# Patient Record
Sex: Female | Born: 2000 | Race: White | Hispanic: No | Marital: Single | Smoking: Never smoker
Health system: Southern US, Community
[De-identification: ages and names within clinical notes are randomized; demographics above are authoritative.]

## PROBLEM LIST (undated history)

## (undated) DIAGNOSIS — F329 Major depressive disorder, single episode, unspecified: Secondary | ICD-10-CM

## (undated) DIAGNOSIS — F32A Depression, unspecified: Secondary | ICD-10-CM

## (undated) DIAGNOSIS — J45909 Unspecified asthma, uncomplicated: Secondary | ICD-10-CM

## (undated) DIAGNOSIS — F419 Anxiety disorder, unspecified: Secondary | ICD-10-CM

## (undated) DIAGNOSIS — Z91010 Allergy to peanuts: Secondary | ICD-10-CM

## (undated) HISTORY — DX: Major depressive disorder, single episode, unspecified: F32.9

## (undated) HISTORY — PX: TONSILLECTOMY: SUR1361

## (undated) HISTORY — DX: Allergy to peanuts: Z91.010

## (undated) HISTORY — DX: Depression, unspecified: F32.A

## (undated) HISTORY — DX: Anxiety disorder, unspecified: F41.9

## (undated) HISTORY — DX: Unspecified asthma, uncomplicated: J45.909

---
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
## 2020-01-12 NOTE — Progress Notes (Deleted)
  Subjective:    CC: mood follow up  HPI: Started Prozac and hydroxyzine for GAD around 4 weeks ago  I reviewed the past medical history, family history, social history, surgical history, and allergies today and no changes were needed.  Please see the problem list section below in epic for further details.  Past Medical History: Past Medical History:  Diagnosis Date  . Anxiety   . Asthma   . Depression   . Peanut allergy    Past Surgical History: Past Surgical History:  Procedure Laterality Date  . TONSILLECTOMY     Social History: Social History   Socioeconomic History  . Marital status: Single    Spouse name: Not on file  . Number of children: Not on file  . Years of education: Not on file  . Highest education level: Not on file  Occupational History  . Not on file  Tobacco Use  . Smoking status: Never Smoker  . Smokeless tobacco: Never Used  Substance and Sexual Activity  . Alcohol use: Yes    Alcohol/week: 4.0 standard drinks    Types: 4 Standard drinks or equivalent per week  . Drug use: Not Currently    Types: Marijuana  . Sexual activity: Yes    Birth control/protection: Injection  Other Topics Concern  . Not on file  Social History Narrative  . Not on file   Social Determinants of Health   Financial Resource Strain:   . Difficulty of Paying Living Expenses:   Food Insecurity:   . Worried About Programme researcher, broadcasting/film/video in the Last Year:   . Barista in the Last Year:   Transportation Needs:   . Freight forwarder (Medical):   Marland Kitchen Lack of Transportation (Non-Medical):   Physical Activity:   . Days of Exercise per Week:   . Minutes of Exercise per Session:   Stress:   . Feeling of Stress :   Social Connections:   . Frequency of Communication with Friends and Family:   . Frequency of Social Gatherings with Friends and Family:   . Attends Religious Services:   . Active Member of Clubs or Organizations:   . Attends Banker  Meetings:   Marland Kitchen Marital Status:    Family History: Family History  Problem Relation Age of Onset  . Alcohol abuse Father    Allergies: Allergies  Allergen Reactions  . Peanut Oil Anaphylaxis and Hives  . Sulfamethoxazole-Trimethoprim Hives and Rash  . Z-Pak [Azithromycin] Hives  . Amoxicillin Rash   Medications: See med rec.  Review of Systems: No fevers, chills, night sweats, weight loss, chest pain, or shortness of breath.   Objective:    General: Well Developed, well nourished, and in no acute distress.  Neuro: Alert and oriented x3.  HEENT: Normocephalic, atraumatic.  Skin: Warm and dry. Cardiac: Regular rate and rhythm, no murmurs rubs or gallops, no lower extremity edema.  Respiratory: Clear to auscultation bilaterally. Not using accessory muscles, speaking in full sentences.   Impression and Recommendations:    No problem-specific Assessment & Plan notes found for this encounter.  No follow-ups on file. ___________________________________________ Thayer Ohm, DNP, APRN, FNP-BC Primary Care and Sports Medicine [facility] [place]

## 2020-01-13 ENCOUNTER — Ambulatory Visit (INDEPENDENT_AMBULATORY_CARE_PROVIDER_SITE_OTHER): Payer: No Typology Code available for payment source | Admitting: Psychology

## 2020-01-13 ENCOUNTER — Ambulatory Visit (INDEPENDENT_AMBULATORY_CARE_PROVIDER_SITE_OTHER): Payer: No Typology Code available for payment source | Admitting: Medical-Surgical

## 2020-01-13 DIAGNOSIS — F331 Major depressive disorder, recurrent, moderate: Secondary | ICD-10-CM

## 2020-01-13 DIAGNOSIS — F411 Generalized anxiety disorder: Secondary | ICD-10-CM

## 2020-01-13 DIAGNOSIS — Z5329 Procedure and treatment not carried out because of patient's decision for other reasons: Secondary | ICD-10-CM

## 2020-01-14 ENCOUNTER — Telehealth (HOSPITAL_COMMUNITY): Payer: No Typology Code available for payment source | Admitting: Psychiatry

## 2020-01-28 ENCOUNTER — Ambulatory Visit (INDEPENDENT_AMBULATORY_CARE_PROVIDER_SITE_OTHER): Payer: No Typology Code available for payment source | Admitting: Psychology

## 2020-01-28 DIAGNOSIS — F331 Major depressive disorder, recurrent, moderate: Secondary | ICD-10-CM

## 2020-01-28 DIAGNOSIS — F411 Generalized anxiety disorder: Secondary | ICD-10-CM

## 2020-02-10 ENCOUNTER — Telehealth: Payer: Self-pay | Admitting: Medical-Surgical

## 2020-02-10 NOTE — Telephone Encounter (Signed)
Please schedule with PCP for the initial discussion of taking over the birth control. Also due for follow up on anxiety. Can do in the same appointment.

## 2020-02-10 NOTE — Telephone Encounter (Signed)
Please schedule the patient an appointment with the provider since she is transferring care/rx. Thanks.

## 2020-02-10 NOTE — Telephone Encounter (Signed)
Left voicemail for patient to call us back to make an appointment.  

## 2020-02-10 NOTE — Telephone Encounter (Signed)
Patient had her last depo shot may 3rd and her time is coming up for next appointment. Would like to start it here with [name] and records can be transferred so we can see that she has been doing it for a while. Please advise if she can see a nurse or if it has to be with PCP.

## 2020-02-11 ENCOUNTER — Ambulatory Visit (INDEPENDENT_AMBULATORY_CARE_PROVIDER_SITE_OTHER): Payer: No Typology Code available for payment source | Admitting: Psychology

## 2020-02-11 DIAGNOSIS — F331 Major depressive disorder, recurrent, moderate: Secondary | ICD-10-CM

## 2020-02-11 DIAGNOSIS — F411 Generalized anxiety disorder: Secondary | ICD-10-CM

## 2020-02-25 ENCOUNTER — Ambulatory Visit (INDEPENDENT_AMBULATORY_CARE_PROVIDER_SITE_OTHER): Payer: Self-pay | Admitting: Physician Assistant

## 2020-02-25 ENCOUNTER — Other Ambulatory Visit: Payer: Self-pay

## 2020-02-25 ENCOUNTER — Encounter: Payer: Self-pay | Admitting: Physician Assistant

## 2020-02-25 VITALS — BP 124/74 | HR 90 | Temp 98.6°F | Ht 61.0 in | Wt 165.0 lb

## 2020-02-25 DIAGNOSIS — N3001 Acute cystitis with hematuria: Secondary | ICD-10-CM

## 2020-02-25 DIAGNOSIS — N898 Other specified noninflammatory disorders of vagina: Secondary | ICD-10-CM

## 2020-02-25 DIAGNOSIS — R300 Dysuria: Secondary | ICD-10-CM

## 2020-02-25 LAB — POCT URINALYSIS DIP (CLINITEK)
Bilirubin, UA: NEGATIVE
Glucose, UA: NEGATIVE mg/dL
Ketones, POC UA: NEGATIVE mg/dL
Nitrite, UA: NEGATIVE
POC PROTEIN,UA: NEGATIVE
Spec Grav, UA: >=1.03 — AB (ref 1.010–1.025)
Urobilinogen, UA: 0.2 E.U./dL
pH, UA: 5.5 (ref 5.0–8.0)

## 2020-02-25 LAB — WET PREP FOR TRICH, YEAST, CLUE
MICRO NUMBER:: 10704062
Specimen Quality: ADEQUATE

## 2020-02-25 MED ORDER — FLUCONAZOLE 150 MG PO TABS: 150.0000 mg | ORAL_TABLET | Freq: Once | ORAL | 0 refills | Status: AC

## 2020-02-25 MED ORDER — NITROFURANTOIN MONOHYD MACRO 100 MG PO CAPS: 100.0000 mg | ORAL_CAPSULE | Freq: Two times a day (BID) | ORAL | 0 refills | Status: DC

## 2020-02-25 NOTE — Progress Notes (Signed)
No trich/yeast/clue cells seen.

## 2020-02-25 NOTE — Progress Notes (Signed)
Subjective:    Patient ID: Kathy Cummings, female    DOB: Apr 10, 2001, 19 y.o.   MRN: [id number]  HPI Pt is a 19 yo female with hx of UTI who presents to the clinic with dysuria and urinary frequency for 2 days. She also has some external itching. No fever, chills, nausea, vomiting, body aches, abdominal or flank pain. Not tried anything to make better. Has had pyelonephritis in the past.   On depo UTD.   .. Active Ambulatory Problems    Diagnosis Date Noted  . Peanut allergy   . Depression in pediatric patient 09/27/2018  . GAD (generalized anxiety disorder) 09/27/2018  . Suicidal thoughts 09/27/2018  . Allergic rhinitis due to pollen 05/01/2008  . Asthma 10/12/2010  . Atopic dermatitis 08/05/2012  . BMI (body mass index), pediatric, 95-99% for age 45/15/2016  . Snoring 03/31/2016  . Allergic to nuts (other than peanuts) 11/24/2011  . Low serum thyroid stimulating hormone (TSH) 04/29/2019   Resolved Ambulatory Problems    Diagnosis Date Noted  . No Resolved Ambulatory Problems   Past Medical History:  Diagnosis Date  . Anxiety   . Depression       Review of Systems See HPI    Objective:   Physical Exam Vitals reviewed.  Constitutional:      Appearance: Normal appearance.  Cardiovascular:     Rate and Rhythm: Normal rate and regular rhythm.     Pulses: Normal pulses.  Pulmonary:     Effort: Pulmonary effort is normal.     Breath sounds: Normal breath sounds.  Abdominal:     General: There is no distension.     Tenderness: There is no abdominal tenderness. There is no right CVA tenderness, left CVA tenderness, guarding or rebound.  Neurological:     General: No focal deficit present.     Mental Status: She is alert.  Psychiatric:        Mood and Affect: Mood normal.           Assessment & Plan:    .Marland KitchenMarland KitchenAlexa was seen today for vaginal itching and dysuria.  Diagnoses and all orders for this visit:  Acute cystitis with hematuria -      nitrofurantoin, macrocrystal-monohydrate, (MACROBID) 100 MG capsule; Take 1 capsule (100 mg total) by mouth 2 (two) times daily.  Vagina itching -     POCT URINALYSIS DIP (CLINITEK) -     WET PREP FOR TRICH, YEAST, CLUE -     Urine Culture -     fluconazole (DIFLUCAN) 150 MG tablet; Take 1 tablet (150 mg total) by mouth once for 1 dose.  Dysuria -     POCT URINALYSIS DIP (CLINITEK) -     WET PREP FOR TRICH, YEAST, CLUE -     Urine Culture    Results for orders placed or performed in visit on 02/25/20  POCT URINALYSIS DIP (CLINITEK)  Result Value Ref Range   Color, UA yellow yellow   Clarity, UA clear clear   Glucose, UA negative negative mg/dL   Bilirubin, UA negative negative   Ketones, POC UA negative negative mg/dL   Spec Grav, UA >=2.423 (A) 1.010 - 1.025   Blood, UA large (A) negative   pH, UA 5.5 5.0 - 8.0   POC PROTEIN,UA negative negative, trace   Urobilinogen, UA 0.2 0.2 or 1.0 E.U./dL   Nitrite, UA Negative Negative   Leukocytes, UA Trace (A) Negative   UA positive for blood and leukocytes.  Will culture.  Wet prep ordered.  Will treat empirically due to symptoms and history with macrobid.  HO given. Follow up as needed or with any changing or worsening symptoms.

## 2020-02-25 NOTE — Patient Instructions (Signed)
Urinary Tract Infection, Adult A urinary tract infection (UTI) is an infection of any part of the urinary tract. The urinary tract includes:  The kidneys.  The ureters.  The bladder.  The urethra. These organs make, store, and get rid of pee (urine) in the body. What are the causes? This is caused by germs (bacteria) in your genital area. These germs grow and cause swelling (inflammation) of your urinary tract. What increases the risk? You are more likely to develop this condition if:  You have a small, thin tube (catheter) to drain pee.  You cannot control when you pee or poop (incontinence).  You are female, and: ? You use these methods to prevent pregnancy:  A medicine that kills sperm (spermicide).  A device that blocks sperm (diaphragm). ? You have low levels of a female hormone (estrogen). ? You are pregnant.  You have genes that add to your risk.  You are sexually active.  You take antibiotic medicines.  You have trouble peeing because of: ? A prostate that is bigger than normal, if you are female. ? A blockage in the part of your body that drains pee from the bladder (urethra). ? A kidney stone. ? A nerve condition that affects your bladder (neurogenic bladder). ? Not getting enough to drink. ? Not peeing often enough.  You have other conditions, such as: ? Diabetes. ? A weak disease-fighting system (immune system). ? Sickle cell disease. ? Gout. ? Injury of the spine. What are the signs or symptoms? Symptoms of this condition include:  Needing to pee right away (urgently).  Peeing often.  Peeing small amounts often.  Pain or burning when peeing.  Blood in the pee.  Pee that smells bad or not like normal.  Trouble peeing.  Pee that is cloudy.  Fluid coming from the vagina, if you are female.  Pain in the belly or lower back. Other symptoms include:  Throwing up (vomiting).  No urge to eat.  Feeling mixed up (confused).  Being tired  and grouchy (irritable).  A fever.  Watery poop (diarrhea). How is this treated? This condition may be treated with:  Antibiotic medicine.  Other medicines.  Drinking enough water. Follow these instructions at home:  Medicines  Take over-the-counter and prescription medicines only as told by your doctor.  If you were prescribed an antibiotic medicine, take it as told by your doctor. Do not stop taking it even if you start to feel better. General instructions  Make sure you: ? Pee until your bladder is empty. ? Do not hold pee for a long time. ? Empty your bladder after sex. ? Wipe from front to back after pooping if you are a female. Use each tissue one time when you wipe.  Drink enough fluid to keep your pee pale yellow.  Keep all follow-up visits as told by your doctor. This is important. Contact a doctor if:  You do not get better after 1-2 days.  Your symptoms go away and then come back. Get help right away if:  You have very bad back pain.  You have very bad pain in your lower belly.  You have a fever.  You are sick to your stomach (nauseous).  You are throwing up. Summary  A urinary tract infection (UTI) is an infection of any part of the urinary tract.  This condition is caused by germs in your genital area.  There are many risk factors for a UTI. These include having a small, thin   tube to drain pee and not being able to control when you pee or poop.  Treatment includes antibiotic medicines for germs.  Drink enough fluid to keep your pee pale yellow. This information is not intended to replace advice given to you by your health care provider. Make sure you discuss any questions you have with your health care provider. Document Revised: 07/18/2018 Document Reviewed: 02/07/2018 Elsevier Patient Education  2020 [employer] Inc[employer]  

## 2020-02-26 LAB — URINE CULTURE
MICRO NUMBER:: 10704813
SPECIMEN QUALITY:: ADEQUATE

## 2020-02-27 NOTE — Progress Notes (Signed)
No significant bacteria growth. Likely sample was contaminated. Ok to finish abx. Have symptoms resolved?

## 2020-03-01 ENCOUNTER — Other Ambulatory Visit: Payer: Self-pay | Admitting: Physician Assistant

## 2020-03-01 DIAGNOSIS — J4520 Mild intermittent asthma, uncomplicated: Secondary | ICD-10-CM

## 2020-03-08 ENCOUNTER — Ambulatory Visit: Payer: No Typology Code available for payment source | Admitting: Psychology

## 2020-03-15 ENCOUNTER — Other Ambulatory Visit: Payer: Self-pay

## 2020-03-15 ENCOUNTER — Encounter: Payer: Self-pay | Admitting: Medical-Surgical

## 2020-03-15 ENCOUNTER — Ambulatory Visit (INDEPENDENT_AMBULATORY_CARE_PROVIDER_SITE_OTHER): Payer: No Typology Code available for payment source | Admitting: Medical-Surgical

## 2020-03-15 VITALS — BP 109/75 | HR 85 | Temp 98.1°F | Ht 61.0 in | Wt 163.7 lb

## 2020-03-15 DIAGNOSIS — F411 Generalized anxiety disorder: Secondary | ICD-10-CM | POA: Diagnosis not present

## 2020-03-15 DIAGNOSIS — Z3042 Encounter for surveillance of injectable contraceptive: Secondary | ICD-10-CM | POA: Diagnosis not present

## 2020-03-15 DIAGNOSIS — H02823 Cysts of right eye, unspecified eyelid: Secondary | ICD-10-CM

## 2020-03-15 LAB — POCT URINE PREGNANCY: Preg Test, Ur: NEGATIVE

## 2020-03-15 MED ORDER — FLUOXETINE HCL 10 MG PO CAPS: 10.0000 mg | ORAL_CAPSULE | Freq: Every day | ORAL | 1 refills | Status: DC

## 2020-03-15 MED ORDER — MEDROXYPROGESTERONE ACETATE 150 MG/ML IM SUSP
150.0000 mg | Freq: Once | INTRAMUSCULAR | Status: AC
  Administered 2020-03-15: 150 mg via INTRAMUSCULAR

## 2020-03-15 MED ORDER — HYDROXYZINE HCL 25 MG PO TABS: 25.0000 mg | ORAL_TABLET | Freq: Three times a day (TID) | ORAL | 2 refills | Status: AC | PRN

## 2020-03-15 NOTE — Progress Notes (Signed)
Subjective:    CC: anxiety follow up, birth control, spot on eye  HPI: Pleasant 19 year old female presenting for the following:  Anxiety/Depression: taking fluoxetine 10mg  daily, tolerating well without side effects. Ran out of her medication about 2-3 weeks ago. Feels that the medicine helps with her symptoms well and would like to continue at the 10mg  dose. Using hydroxyzine 2-3 times per week with good control of anxiety. Doing counseling twice monthly (every 2 weeks) and reports she is doing much better. Denies SI/HI.  Birth control: Has been doing Depo-Provera shots for a couple of years. Last injection on 12/15/2019 at her GYN office. Would like to switch her injections to our office. Currently sexually active with one female partner, using condoms with most encounters but not all. Had some vaginal mucus a couple of weeks ago that was similar to ovulation. Having very short irregular bleeding for approximately 24 hours then stops.   Spot on eye: has had a spot on the outer right corner of her eye that is white for almost a month. It doesn't hurt or itch. She has tried popping it and had no success. It doesn't bother her, it's more a cosmetic concern.  I reviewed the past medical history, family history, social history, surgical history, and allergies today and no changes were needed.  Please see the problem list section below in epic for further details.  Past Medical History: Past Medical History:  Diagnosis Date  . Anxiety   . Asthma   . Depression   . Peanut allergy    Past Surgical History: Past Surgical History:  Procedure Laterality Date  . TONSILLECTOMY     Social History: Social History   Socioeconomic History  . Marital status: Single    Spouse name: Not on file  . Number of children: Not on file  . Years of education: Not on file  . Highest education level: Not on file  Occupational History  . Not on file  Tobacco Use  . Smoking status: Never Smoker  .  Smokeless tobacco: Never Used  Vaping Use  . Vaping Use: Never used  Substance and Sexual Activity  . Alcohol use: Yes    Alcohol/week: 4.0 standard drinks    Types: 4 Standard drinks or equivalent per week  . Drug use: Not Currently    Types: Marijuana  . Sexual activity: Yes    Birth control/protection: Injection  Other Topics Concern  . Not on file  Social History Narrative  . Not on file   Social Determinants of Health   Financial Resource Strain:   . Difficulty of Paying Living Expenses:   Food Insecurity:   . Worried About in the Last Year:   . 02/14/2020 in the Last Year:   Transportation Needs:   . Programme researcher, broadcasting/film/video (Medical):   Barista Lack of Transportation (Non-Medical):   Physical Activity:   . Days of Exercise per Week:   . Minutes of Exercise per Session:   Stress:   . Feeling of Stress :   Social Connections:   . Frequency of Communication with Friends and Family:   . Frequency of Social Gatherings with Friends and Family:   . Attends Religious Services:   . Active Member of Clubs or Organizations:   . Attends Freight forwarder Meetings:   Marland Kitchen Marital Status:    Family History: Family History  Problem Relation Age of Onset  . Alcohol abuse Father    Allergies:  Allergies  Allergen Reactions  . Peanut Oil Anaphylaxis and Hives  . Sulfamethoxazole-Trimethoprim Hives and Rash  . Z-Pak [Azithromycin] Hives  . Amoxicillin Rash   Medications: See med rec.  Review of Systems: See HPI for pertinent positives and negatives.   Depression screen [facility] 2/9 03/15/2020 12/16/2019 04/28/2019 10/15/2018 09/27/2018  Decreased Interest 2 2 0 1 2  Down, Depressed, Hopeless 1 1 0 2 2  PHQ - 2 Score 3 3 0 3 4  Altered sleeping 2 2 2 2 3   Tired, decreased energy 2 2 2 1 2   Change in appetite 1 2 2 2 1   Feeling bad or failure about yourself  1 1 0 2 2  Trouble concentrating 2 2 2 3 2   Moving slowly or fidgety/restless 0 0 0 0 0  Suicidal  thoughts 0 0 0 2 1  PHQ-9 Score 11 12 8 15 15   Difficult doing work/chores Somewhat difficult Somewhat difficult - - -   GAD 7 : Generalized Anxiety Score 03/15/2020 12/16/2019 04/28/2019 10/15/2018  Nervous, Anxious, on Edge 2 2 1 3   Control/stop worrying 2 2 1 2   Worry too much - different things 2 2 1 2   Trouble relaxing 1 1 0 2  Restless 1 0 0 3  Easily annoyed or irritable 3 3 3 3   Afraid - awful might happen 0 1 0 2  Total GAD 7 Score 11 11 6 17   Anxiety Difficulty Very difficult Very difficult - -    Objective:    General: Well Developed, well nourished, and in no acute distress.  Neuro: Alert and oriented x3.  HEENT: Normocephalic, atraumatic.  Skin: Warm and dry. Small pinpoint white lesion without erythema, edema, or drainage. Cardiac: Regular rate and rhythm, no murmurs rubs or gallops, no lower extremity edema.  Respiratory: Clear to auscultation bilaterally. Not using accessory muscles, speaking in full sentences.  Impression and Recommendations:    1. Encounter for surveillance of injectable contraceptive POCT UPT due to recent mucoid changes similar to ovulation- negative. Depo-Provera injection given by MA. Patient instructed to return in 12-14 weeks (06/07/2020-06/21/2020) for next injection. Continue condom use with EVERY encounter to protect from STIs.  2. GAD (generalized anxiety disorder) Continue fluoxetine 10mg  daily and hydroxyzine 25mg  TID prn anxiety. Continue counseling as instructed. If symptoms worsen or medication doesn't manage as well, advised to return for discussion of increasing the fluoxetine dose. Refills provided.  3. Milia of right eye Discussed benign nature of milia. May consider using warm compresses several times daily to help resolve. Avoid squeezing or manipulating the site as this may predispose to infection.   Return in about 13 weeks (around 06/14/2020) for next depo-provera injection; 6 months for mood follow  up. ___________________________________________ 12/15/2018, DNP, APRN, FNP-BC Primary Care and Sports Medicine [facility] [place]

## 2020-06-14 ENCOUNTER — Other Ambulatory Visit: Payer: Self-pay

## 2020-06-14 ENCOUNTER — Ambulatory Visit (INDEPENDENT_AMBULATORY_CARE_PROVIDER_SITE_OTHER): Payer: No Typology Code available for payment source | Admitting: Medical-Surgical

## 2020-06-14 DIAGNOSIS — Z3042 Encounter for surveillance of injectable contraceptive: Secondary | ICD-10-CM

## 2020-06-14 LAB — POCT URINE PREGNANCY: Preg Test, Ur: NEGATIVE

## 2020-06-14 MED ORDER — MEDROXYPROGESTERONE ACETATE 150 MG/ML IM SUSP
150.0000 mg | Freq: Once | INTRAMUSCULAR | Status: AC
  Administered 2020-06-14: 150 mg via INTRAMUSCULAR

## 2020-06-14 NOTE — Progress Notes (Signed)
Established Patient Office Visit  Subjective:  Patient ID: Kathy Cummings, female    DOB: Oct 26, 2000  Age: 19 y.o. MRN: [id number]  CC:  Chief Complaint  Patient presents with  . Contraception    HPI Kathy Cummings is here for a Depo Provera injection. Denies chest pain, shortness of breath, headaches, mood changes or problems with medication. It has been < 14 weeks since her last Depo Provera injection.   Past Medical History:  Diagnosis Date  . Anxiety   . Asthma   . Depression   . Peanut allergy     Past Surgical History:  Procedure Laterality Date  . TONSILLECTOMY      Family History  Problem Relation Age of Onset  . Alcohol abuse Father     Social History   Socioeconomic History  . Marital status: Single    Spouse name: Not on file  . Number of children: Not on file  . Years of education: Not on file  . Highest education level: Not on file  Occupational History  . Not on file  Tobacco Use  . Smoking status: Never Smoker  . Smokeless tobacco: Never Used  Vaping Use  . Vaping Use: Never used  Substance and Sexual Activity  . Alcohol use: Yes    Alcohol/week: 4.0 standard drinks    Types: 4 Standard drinks or equivalent per week  . Drug use: Not Currently    Types: Marijuana  . Sexual activity: Yes    Birth control/protection: Injection  Other Topics Concern  . Not on file  Social History Narrative  . Not on file   Social Determinants of Health   Financial Resource Strain:   . Difficulty of Paying Living Expenses: Not on file  Food Insecurity:   . Worried About Programme researcher, broadcasting/film/video in the Last Year: Not on file  . Ran Out of Food in the Last Year: Not on file  Transportation Needs:   . Lack of Transportation (Medical): Not on file  . Lack of Transportation (Non-Medical): Not on file  Physical Activity:   . Days of Exercise per Week: Not on file  . Minutes of Exercise per Session: Not on file  Stress:   . Feeling of Stress : Not  on file  Social Connections:   . Frequency of Communication with Friends and Family: Not on file  . Frequency of Social Gatherings with Friends and Family: Not on file  . Attends Religious Services: Not on file  . Active Member of Clubs or Organizations: Not on file  . Attends Banker Meetings: Not on file  . Marital Status: Not on file  Intimate Partner Violence:   . Fear of Current or Ex-Partner: Not on file  . Emotionally Abused: Not on file  . Physically Abused: Not on file  . Sexually Abused: Not on file    Outpatient Medications Prior to Visit  Medication Sig Dispense Refill  . albuterol (VENTOLIN HFA) 108 (90 Base) MCG/ACT inhaler INHALE 1 TO 2 PUFFS INTO THE LUNGS EVERY 4 HOURS AS NEEDED FOR WHEEZING OR SHORTNESS OF BREATH. 36 g 1  . EPINEPHrine 0.3 mg/0.3 mL IJ SOAJ injection Inject 0.3 mLs (0.3 mg total) into the muscle once as needed (anaphylaxis/allergic reaction). 2 each 3  . FLUoxetine (PROZAC) 10 MG capsule Take 1 capsule (10 mg total) by mouth daily. 90 capsule 1  . hydrocortisone 2.5 % ointment Apply to affected area BID. 30 g 3  . hydrOXYzine (  ATARAX/VISTARIL) 25 MG tablet Take 1 tablet (25 mg total) by mouth 3 (three) times daily as needed. 90 tablet 2  . medroxyPROGESTERone (DEPO-PROVERA) 150 MG/ML injection Inject 150 mg into the muscle every 3 (three) months.     No facility-administered medications prior to visit.    Allergies  Allergen Reactions  . Peanut Oil Anaphylaxis and Hives  . Sulfamethoxazole-Trimethoprim Hives and Rash  . Z-Pak [Azithromycin] Hives  . Amoxicillin Rash    ROS Review of Systems    Objective:    Physical Exam  There were no vitals taken for this visit. Wt Readings from Last 3 Encounters:  03/15/20 163 lb 11.2 oz (74.3 kg) (90 %, Z= 1.30)*  02/25/20 165 lb (74.8 kg) (91 %, Z= 1.34)*  12/16/19 171 lb 6.4 oz (77.7 kg) (93 %, Z= 1.49)*   * Growth percentiles are based on CDC (Girls, 2-20 Years) data.      Health Maintenance Due  Topic Date Due  . INFLUENZA VACCINE  03/14/2020  . CHLAMYDIA SCREENING  04/27/2020    There are no preventive care reminders to display for this patient.  No results found for: TSH Lab Results  Component Value Date   WBC 5.8 04/28/2019   HGB 13.3 04/28/2019   HCT 38.1 04/28/2019   MCV 91.1 04/28/2019   PLT 302 04/28/2019   Lab Results  Component Value Date   NA 141 04/28/2019   K 4.1 04/28/2019   CO2 25 04/28/2019   GLUCOSE 92 04/28/2019   BUN 9 04/28/2019   CREATININE 0.73 04/28/2019   BILITOT 1.0 04/28/2019   AST 12 04/28/2019   ALT 8 04/28/2019   PROT 7.2 04/28/2019   CALCIUM 9.9 04/28/2019   ANIONGAP 11 01/15/2019   Lab Results  Component Value Date   CHOL 190 (H) 09/27/2018   Lab Results  Component Value Date   HDL 52 09/27/2018   Lab Results  Component Value Date   LDLCALC 121 (H) 09/27/2018   Lab Results  Component Value Date   TRIG 78 09/27/2018   Lab Results  Component Value Date   CHOLHDL 3.7 09/27/2018   Lab Results  Component Value Date   HGBA1C 5.0 09/27/2018      Assessment & Plan:  Contraception - Patient tolerated injection well without complications. Patient advised to schedule next injection between 08/31/2019 - 09/14/2019  Problem List Items Addressed This Visit    None    Visit Diagnoses    Encounter for surveillance of injectable contraceptive    -  Primary   Relevant Medications   medroxyPROGESTERone (DEPO-PROVERA) injection 150 mg (Completed) (Start on 06/14/2020  4:00 PM)   Other Relevant Orders   POCT urine pregnancy      Meds ordered this encounter  Medications  . medroxyPROGESTERone (DEPO-PROVERA) injection 150 mg    Follow-up: Return in about 12 weeks (around 09/06/2020) for Depo Provera injection.    Esmond Harps, CMA

## 2020-06-28 ENCOUNTER — Other Ambulatory Visit: Payer: Self-pay

## 2020-06-28 ENCOUNTER — Emergency Department (INDEPENDENT_AMBULATORY_CARE_PROVIDER_SITE_OTHER)
Admission: EM | Admit: 2020-06-28 | Discharge: 2020-06-28 | Disposition: A | Discharge status: Home / Self Care | Payer: No Typology Code available for payment source | Source: Home / Self Care

## 2020-06-28 ENCOUNTER — Encounter: Payer: Self-pay | Admitting: Emergency Medicine

## 2020-06-28 DIAGNOSIS — H6691 Otitis media, unspecified, right ear: Secondary | ICD-10-CM

## 2020-06-28 DIAGNOSIS — J0100 Acute maxillary sinusitis, unspecified: Secondary | ICD-10-CM

## 2020-06-28 DIAGNOSIS — Z9189 Other specified personal risk factors, not elsewhere classified: Secondary | ICD-10-CM

## 2020-06-28 MED ORDER — CEFDINIR 300 MG PO CAPS: 300.0000 mg | ORAL_CAPSULE | Freq: Two times a day (BID) | ORAL | 0 refills | Status: AC

## 2020-06-28 NOTE — ED Provider Notes (Signed)
[facility]    CSN: [id number] Arrival date & time: 06/28/20  1336      History   Chief Complaint Chief Complaint  Patient presents with  . Sore Throat  . Generalized Body Aches    HPI Kathy Cummings is a 19 y.o. female.   HPI Kathy Cummings is a 19 y.o. female presenting to UC with c/o 3 days of worsening cough, congestion, sinus pain and pressure and ear pain.  Mild cough from post-nasal drainage. Denies chest pain or SOB. No fever, chills, n/v/d. She has taken ibuprofen and used flonase without relief.  A friend has had similar symptoms but has not been tested for COVID for flu. Pt received COVID Pfizer vaccine April 2021.    Past Medical History:  Diagnosis Date  . Anxiety   . Asthma   . Depression   . Peanut allergy     Patient Active Problem List   Diagnosis Date Noted  . Low serum thyroid stimulating hormone (TSH) 04/29/2019  . Depression in pediatric patient 09/27/2018  . GAD (generalized anxiety disorder) 09/27/2018  . Suicidal thoughts 09/27/2018  . Peanut allergy   . Snoring 03/31/2016  . BMI (body mass index), pediatric, 95-99% for age 29/15/2016  . Atopic dermatitis 08/05/2012  . Allergic to nuts (other than peanuts) 11/24/2011  . Asthma 10/12/2010  . Allergic rhinitis due to pollen 05/01/2008    Past Surgical History:  Procedure Laterality Date  . TONSILLECTOMY      OB History   No obstetric history on file.      Home Medications    Prior to Admission medications   Medication Sig Start Date End Date Taking? Authorizing Provider  albuterol (VENTOLIN HFA) 108 (90 Base) MCG/ACT inhaler INHALE 1 TO 2 PUFFS INTO THE LUNGS EVERY 4 HOURS AS NEEDED FOR WHEEZING OR SHORTNESS OF BREATH. 03/01/20  Yes Jessup, [name], NP  FLUoxetine (PROZAC) 10 MG capsule Take 1 capsule (10 mg total) by mouth daily. 03/15/20  Yes Christen Butter, NP  hydrOXYzine (ATARAX/VISTARIL) 25 MG tablet Take 1 tablet (25 mg total) by mouth 3 (three) times daily as  needed. 03/15/20  Yes Christen Butter, NP  cefdinir (OMNICEF) 300 MG capsule Take 1 capsule (300 mg total) by mouth 2 (two) times daily for 10 days. 06/28/20 07/08/20  Lurene Shadow, PA-C  EPINEPHrine 0.3 mg/0.3 mL IJ SOAJ injection Inject 0.3 mLs (0.3 mg total) into the muscle once as needed (anaphylaxis/allergic reaction). 04/28/19   Carlis Stable, PA-C  hydrocortisone 2.5 % ointment Apply to affected area BID. 12/16/19   Christen Butter, NP  medroxyPROGESTERone (DEPO-PROVERA) 150 MG/ML injection Inject 150 mg into the muscle every 3 (three) months.    [provider]    Family History Family History  Problem Relation Age of Onset  . Alcohol abuse Father   . Healthy Mother   . Healthy Sister     Social History Social History   Tobacco Use  . Smoking status: Never Smoker  . Smokeless tobacco: Never Used  Vaping Use  . Vaping Use: Never used  Substance Use Topics  . Alcohol use: Yes    Alcohol/week: 4.0 standard drinks    Types: 4 Standard drinks or equivalent per week  . Drug use: Not Currently    Types: Marijuana     Allergies   Peanut oil, Sulfamethoxazole-trimethoprim, Z-pak [azithromycin], and Amoxicillin   Review of Systems Review of Systems  Constitutional: Negative for chills and fever.  HENT: Positive  for congestion, ear pain, postnasal drip, sinus pressure and sinus pain. Negative for sore throat, trouble swallowing and voice change.   Respiratory: Positive for cough. Negative for shortness of breath.   Cardiovascular: Negative for chest pain and palpitations.  Gastrointestinal: Negative for abdominal pain, diarrhea, nausea and vomiting.  Musculoskeletal: Negative for arthralgias, back pain and myalgias.  Skin: Negative for rash.  All other systems reviewed and are negative.    Physical Exam Triage Vital Signs ED Triage Vitals  Enc Vitals Group     BP 06/28/20 1402 109/76     Pulse Rate 06/28/20 1402 (!) 102     Resp 06/28/20 1402 16      Temp 06/28/20 1402 98.9 F (37.2 C)     Temp Source 06/28/20 1402 Oral     SpO2 06/28/20 1402 98 %     Weight 06/28/20 1409 165 lb (74.8 kg)     Height 06/28/20 1409 5' (1.524 m)     Head Circumference --      Peak Flow --      Pain Score 06/28/20 1408 3     Pain Loc --      Pain Edu? --      Excl. in GC? --    No data found.  Updated Vital Signs BP 109/76 (BP Location: Right Arm)   Pulse (!) 102   Temp 98.9 F (37.2 C) (Oral)   Resp 16   Ht 5' (1.524 m)   Wt 165 lb (74.8 kg)   SpO2 98%   BMI 32.22 kg/m   Visual Acuity Right Eye Distance:   Left Eye Distance:   Bilateral Distance:    Right Eye Near:   Left Eye Near:    Bilateral Near:     Physical Exam Vitals and nursing note reviewed.  Constitutional:      General: She is not in acute distress.    Appearance: She is well-developed. She is not ill-appearing, toxic-appearing or diaphoretic.  HENT:     Head: Normocephalic and atraumatic.     Right Ear: Ear canal normal. Tympanic membrane is erythematous and bulging.     Left Ear: Ear canal normal. Tympanic membrane is erythematous. Tympanic membrane is not bulging.     Nose: Congestion present.     Right Sinus: Maxillary sinus tenderness present. No frontal sinus tenderness.     Left Sinus: Maxillary sinus tenderness present. No frontal sinus tenderness.     Mouth/Throat:     Lips: Pink.     Mouth: Mucous membranes are moist.     Pharynx: Oropharynx is clear. Uvula midline.  Cardiovascular:     Rate and Rhythm: Normal rate and regular rhythm.  Pulmonary:     Effort: Pulmonary effort is normal. No respiratory distress.     Breath sounds: Normal breath sounds. No stridor. No wheezing, rhonchi or rales.  Musculoskeletal:        General: Normal range of motion.     Cervical back: Normal range of motion and neck supple.  Lymphadenopathy:     Cervical: No cervical adenopathy.  Skin:    General: Skin is warm and dry.  Neurological:     Mental Status: She is  alert and oriented to person, place, and time.  Psychiatric:        Behavior: Behavior normal.      UC Treatments / Results  Labs (all labs ordered are listed, but only abnormal results are displayed) Labs Reviewed  COVID-19, FLU A+B AND RSV  EKG   Radiology No results found.  Procedures Procedures (including critical care time)  Medications Ordered in UC Medications - No data to display  Initial Impression / Assessment and Plan / UC Course  I have reviewed the triage vital signs and the nursing notes.  Pertinent labs & imaging results that were available during my care of the patient were reviewed by me and considered in my medical decision making (see chart for details).    Hx and exam c/w Right AOM and sinusitis given sinus tenderness.  COVID/RSV/Flue test pending Rx: cefdinir (rash with PCN and headache/diarrhea with azithromycin) F/u with PCP as needed AVS and work and school note provided  Final Clinical Impressions(s) / UC Diagnoses   Final diagnoses:  At increased risk of exposure to COVID-19 virus  Right acute otitis media  Acute non-recurrent maxillary sinusitis     Discharge Instructions      You may take 500mg  acetaminophen every 4-6 hours or in combination with ibuprofen 400-600mg  every 6-8 hours as needed for pain, inflammation, and fever.  Be sure to well hydrated with clear liquids and get at least 8 hours of sleep at night, preferably more while sick.   Please follow up with family medicine in 1 week if needed.  Please take antibiotics as prescribed and be sure to complete entire course even if you start to feel better to ensure infection does not come back.     ED Prescriptions    Medication Sig Dispense Auth. Provider   cefdinir (OMNICEF) 300 MG capsule Take 1 capsule (300 mg total) by mouth 2 (two) times daily for 10 days. 20 capsule , PA-C     PDMP not reviewed this encounter.   Lurene Shadow, Lurene Shadow 06/28/20  1445

## 2020-06-28 NOTE — ED Triage Notes (Signed)
Sinus congestion & body aches since Friday  Mild cough due to sinus drainage  Denies fever  OTC ibuprofen & flonase  Pt took benadryl x 1 because she thought it might be her allergies, no relief  Pfizer vaccine in April

## 2020-06-28 NOTE — Discharge Instructions (Signed)
You may take 500mg acetaminophen every 4-6 hours or in combination with ibuprofen 400-600mg every 6-8 hours as needed for pain, inflammation, and fever. ° °Be sure to well hydrated with clear liquids and get at least 8 hours of sleep at night, preferably more while sick.  ° °Please follow up with family medicine in 1 week if needed. ° °Please take antibiotics as prescribed and be sure to complete entire course even if you start to feel better to ensure infection does not come back. ° °

## 2020-06-30 LAB — COVID-19, FLU A+B AND RSV
Influenza A, NAA: NOT DETECTED
Influenza B, NAA: NOT DETECTED
RSV, NAA: NOT DETECTED
SARS-CoV-2, NAA: NOT DETECTED

## 2020-08-26 ENCOUNTER — Other Ambulatory Visit: Payer: Self-pay

## 2020-08-26 DIAGNOSIS — J4520 Mild intermittent asthma, uncomplicated: Secondary | ICD-10-CM

## 2020-08-26 DIAGNOSIS — F411 Generalized anxiety disorder: Secondary | ICD-10-CM

## 2020-08-26 MED ORDER — FLUOXETINE HCL 10 MG PO CAPS: 10.0000 mg | ORAL_CAPSULE | Freq: Every day | ORAL | 0 refills | Status: AC

## 2020-08-26 MED ORDER — ALBUTEROL SULFATE HFA 108 (90 BASE) MCG/ACT IN AERS: 1.0000 | INHALATION_SPRAY | RESPIRATORY_TRACT | 0 refills | Status: AC | PRN

## 2020-08-30 ENCOUNTER — Ambulatory Visit: Payer: No Typology Code available for payment source

## 2020-08-31 ENCOUNTER — Ambulatory Visit: Payer: No Typology Code available for payment source

## 2023-01-24 ENCOUNTER — Telehealth: Payer: Self-pay | Admitting: General Practice

## 2023-01-24 NOTE — Transitions of Care (Post Inpatient/ED Visit) (Signed)
   01/24/2023  Name: Kathy Cummings MRN: [id number] DOB: [birth year]  Today's TOC FU Call Status: Today's TOC FU Call Status:: Successful TOC FU Call Competed TOC FU Call Complete Date: 01/24/23  Transition Care Management Follow-up Telephone Call Date of Discharge: 01/23/23 Discharge Facility: Other ([facility]) Name of Other ([facility]) Discharge Facility: [facility] Type of Discharge: Emergency Department Reason for ED Visit: Other: (head injury)  Items Reviewed: None  Patient stated that she has a new PCP at [facility]. Will remove Christen Butter, NP as PCP.  SIGNATURE: Modesto Charon, RN BSN Nurse Health Advisor
